# Patient Record
Sex: Male | Born: 1965 | Hispanic: Yes | Marital: Married | Smoking: Never smoker
Health system: Southern US, Community
[De-identification: ages and names within clinical notes are randomized; demographics above are authoritative.]

## PROBLEM LIST (undated history)

## (undated) DIAGNOSIS — R739 Hyperglycemia, unspecified: Secondary | ICD-10-CM

## (undated) DIAGNOSIS — J45909 Unspecified asthma, uncomplicated: Secondary | ICD-10-CM

## (undated) DIAGNOSIS — E119 Type 2 diabetes mellitus without complications: Secondary | ICD-10-CM

## (undated) DIAGNOSIS — M170 Bilateral primary osteoarthritis of knee: Secondary | ICD-10-CM

## (undated) DIAGNOSIS — E669 Obesity, unspecified: Secondary | ICD-10-CM

## (undated) DIAGNOSIS — I10 Essential (primary) hypertension: Secondary | ICD-10-CM

## (undated) DIAGNOSIS — M109 Gout, unspecified: Secondary | ICD-10-CM

## (undated) DIAGNOSIS — G4730 Sleep apnea, unspecified: Secondary | ICD-10-CM

## (undated) DIAGNOSIS — E7800 Pure hypercholesterolemia, unspecified: Secondary | ICD-10-CM

## (undated) HISTORY — DX: Pure hypercholesterolemia, unspecified: E78.00

## (undated) HISTORY — DX: Hyperglycemia, unspecified: R73.9

## (undated) HISTORY — DX: Sleep apnea, unspecified: G47.30

## (undated) HISTORY — DX: Type 2 diabetes mellitus without complications: E11.9

## (undated) HISTORY — DX: Gout, unspecified: M10.9

## (undated) HISTORY — DX: Bilateral primary osteoarthritis of knee: M17.0

## (undated) HISTORY — DX: Essential (primary) hypertension: I10

## (undated) HISTORY — DX: Obesity, unspecified: E66.9

---

## 2008-11-09 ENCOUNTER — Ambulatory Visit: Payer: Self-pay | Admitting: Gastroenterology

## 2009-03-05 ENCOUNTER — Ambulatory Visit: Payer: Self-pay | Admitting: Otolaryngology

## 2009-07-29 IMAGING — RF DG UGI W/ KUB
1 series · 15 of 19 positions shown · non-contrast
Comparison: none

REASON FOR EXAM: atypical chest pain
COMMENTS:

[Series 1: run · 15 of 19 slices shown]
[im 1/19]
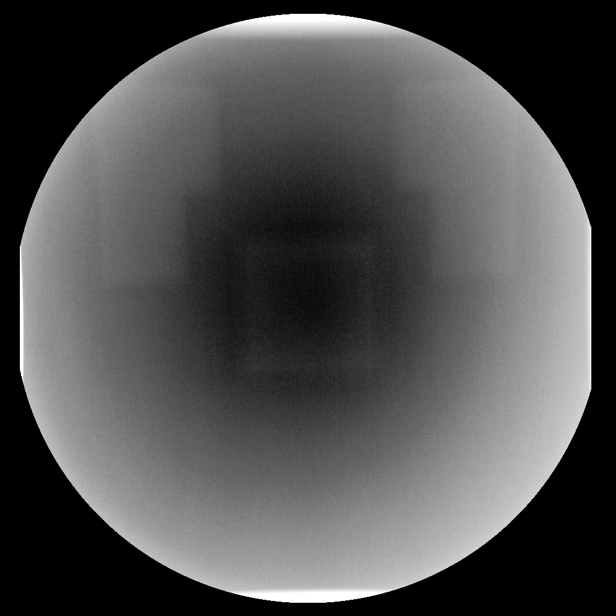
[im 2/19]
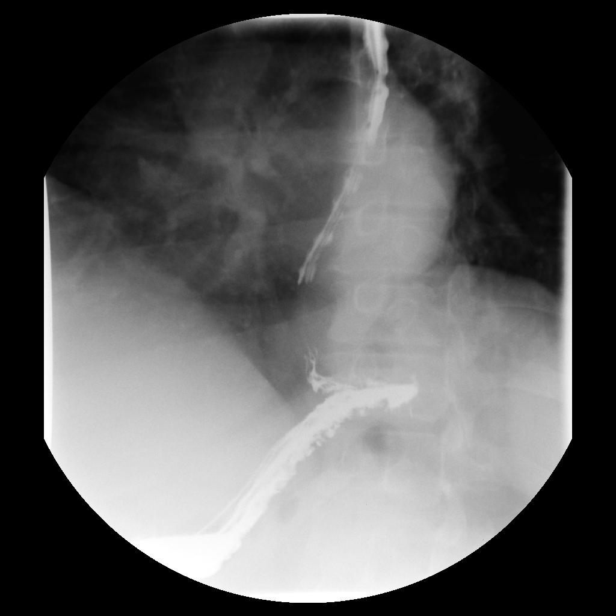
[im 4/19]
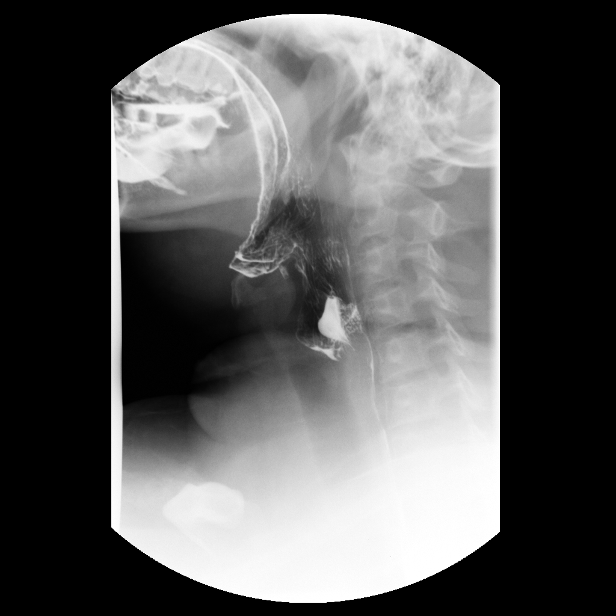
[im 5/19]
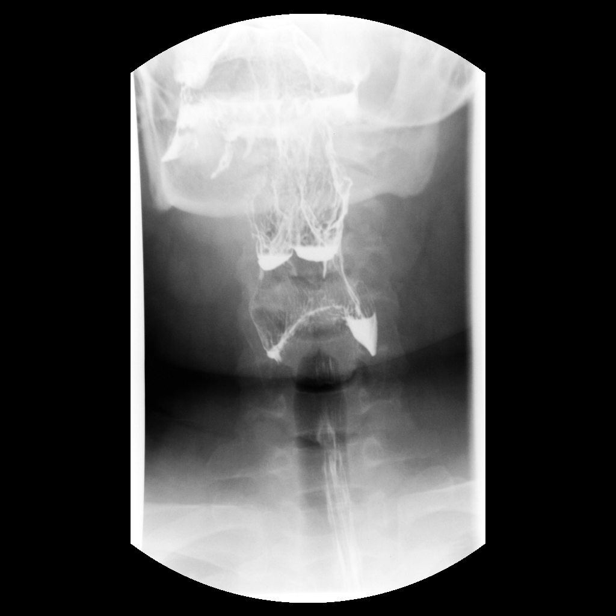
[im 6/19]
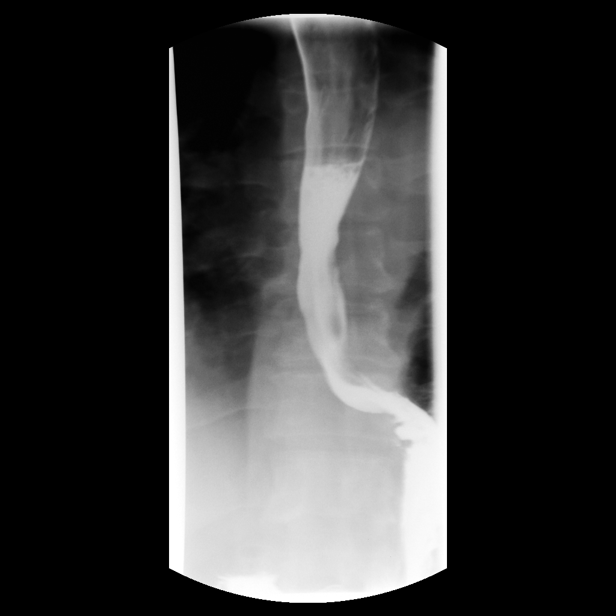
[im 7/19]
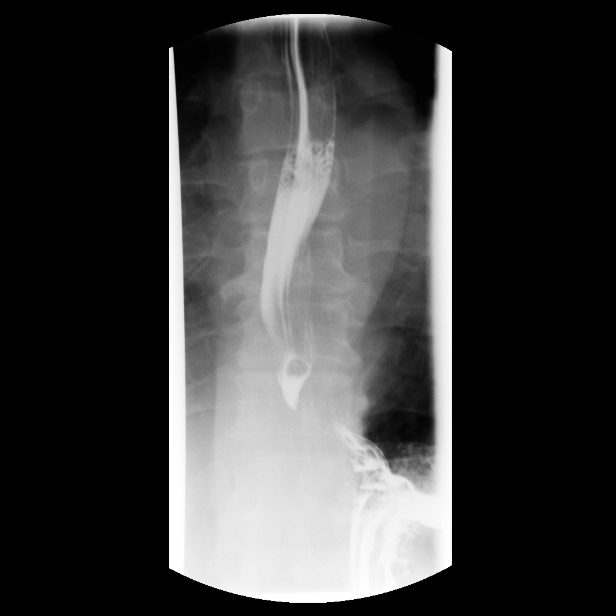
[im 9/19]
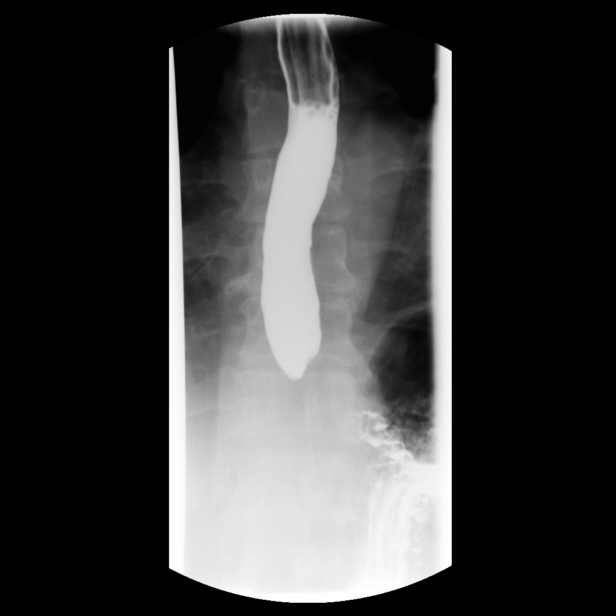
[im 10/19]
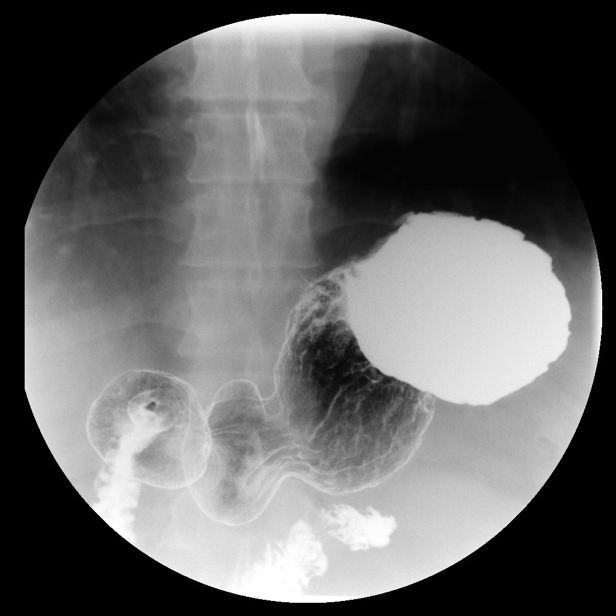
[im 11/19]
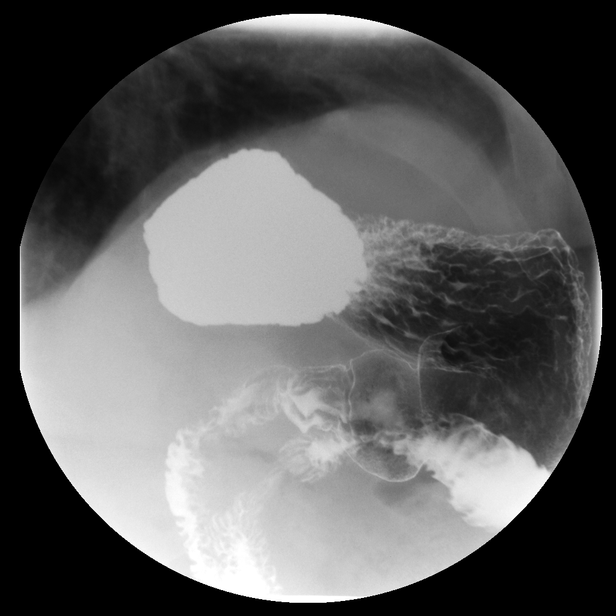
[im 13/19]
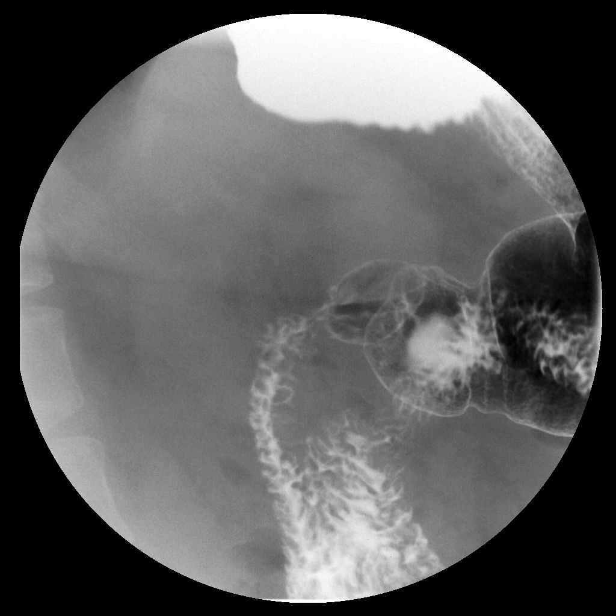
[im 14/19]
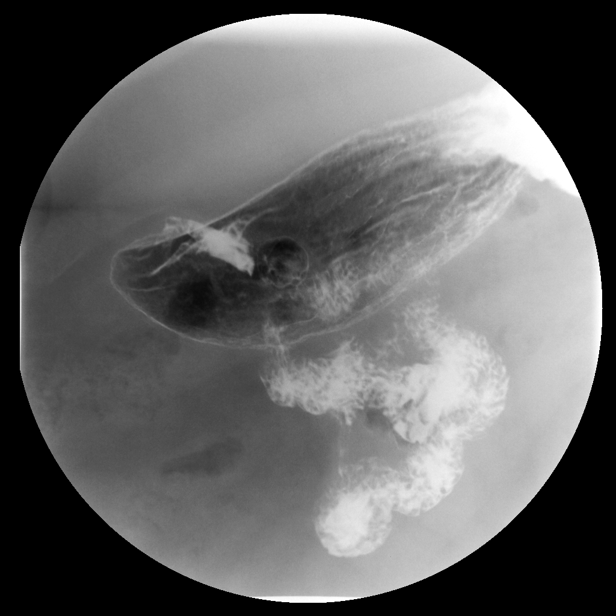
[im 15/19]
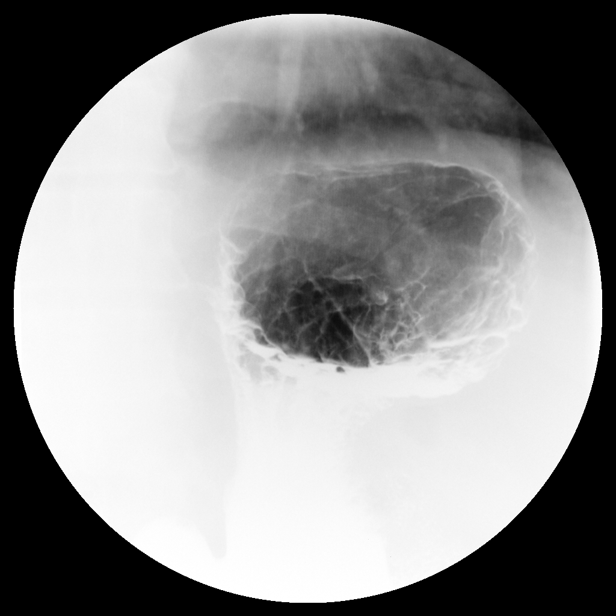
[im 16/19]
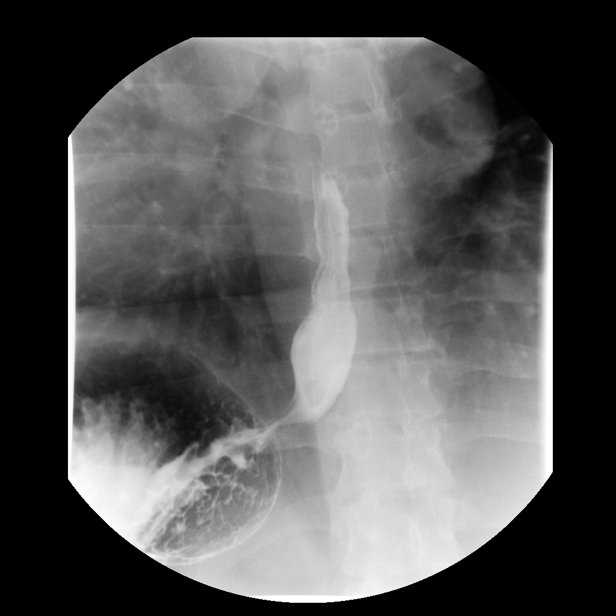
[im 18/19]
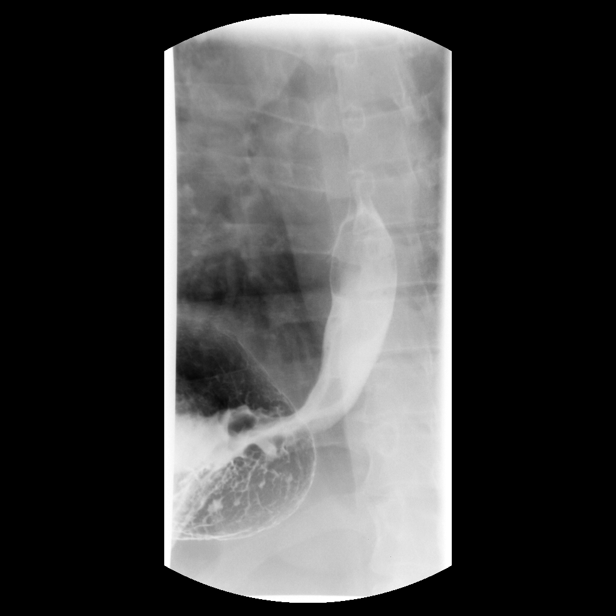
[im 19/19]
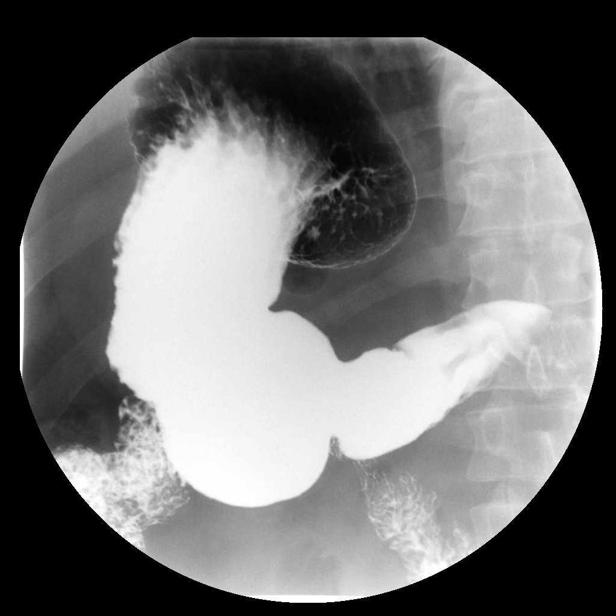

[15 of 19 positions shown; findings below may reference images not displayed]

PROCEDURE:     FL  - FL UPPER GI W/ BARIUM SWALLOW  - November 09, 2008  [DATE]

RESULT:     The anticipated procedure was discussed with Mr. El Flaco and he
voiced his willingness to proceed. The patient ingested barium without
difficulty. There was no laryngeal penetration of the barium. Esophageal
motility was normal. There was no evidence of stricture or esophagitis.
There was a small reducible hiatal hernia. No significant gastroesophageal
reflux was demonstrated. A 12 mm barium pill passed without difficulty.

The stomach distended well. There was no evidence of an ulceration or mass.
The gastric mucosal folds were not abnormally thickened. The duodenal bulb
and C-sweep are normal in appearance.
IMPRESSION: Normal barium swallow with upper GI series.

## 2011-12-14 ENCOUNTER — Ambulatory Visit: Payer: Self-pay | Admitting: Family Medicine

## 2011-12-19 ENCOUNTER — Ambulatory Visit: Payer: Self-pay | Admitting: Family Medicine

## 2012-01-31 ENCOUNTER — Ambulatory Visit: Payer: Self-pay | Admitting: Gastroenterology

## 2016-12-22 DIAGNOSIS — G4733 Obstructive sleep apnea (adult) (pediatric): Secondary | ICD-10-CM | POA: Diagnosis not present

## 2017-03-23 DIAGNOSIS — G4733 Obstructive sleep apnea (adult) (pediatric): Secondary | ICD-10-CM | POA: Diagnosis not present

## 2017-05-16 DIAGNOSIS — I10 Essential (primary) hypertension: Secondary | ICD-10-CM | POA: Diagnosis not present

## 2017-05-16 DIAGNOSIS — J4521 Mild intermittent asthma with (acute) exacerbation: Secondary | ICD-10-CM | POA: Diagnosis not present

## 2017-05-16 DIAGNOSIS — G4733 Obstructive sleep apnea (adult) (pediatric): Secondary | ICD-10-CM | POA: Diagnosis not present

## 2017-06-29 DIAGNOSIS — G4733 Obstructive sleep apnea (adult) (pediatric): Secondary | ICD-10-CM | POA: Diagnosis not present

## 2017-08-03 DIAGNOSIS — M25561 Pain in right knee: Secondary | ICD-10-CM | POA: Diagnosis not present

## 2017-08-03 DIAGNOSIS — Z0000 Encounter for general adult medical examination without abnormal findings: Secondary | ICD-10-CM | POA: Diagnosis not present

## 2017-10-02 DIAGNOSIS — G4733 Obstructive sleep apnea (adult) (pediatric): Secondary | ICD-10-CM | POA: Diagnosis not present

## 2017-10-05 DIAGNOSIS — Z23 Encounter for immunization: Secondary | ICD-10-CM | POA: Diagnosis not present

## 2017-10-22 DIAGNOSIS — B9689 Other specified bacterial agents as the cause of diseases classified elsewhere: Secondary | ICD-10-CM | POA: Diagnosis not present

## 2017-10-22 DIAGNOSIS — J019 Acute sinusitis, unspecified: Secondary | ICD-10-CM | POA: Diagnosis not present

## 2017-10-22 DIAGNOSIS — J209 Acute bronchitis, unspecified: Secondary | ICD-10-CM | POA: Diagnosis not present

## 2018-01-07 DIAGNOSIS — J22 Unspecified acute lower respiratory infection: Secondary | ICD-10-CM | POA: Diagnosis not present

## 2018-01-07 DIAGNOSIS — R6889 Other general symptoms and signs: Secondary | ICD-10-CM | POA: Diagnosis not present

## 2018-01-08 DIAGNOSIS — G4733 Obstructive sleep apnea (adult) (pediatric): Secondary | ICD-10-CM | POA: Diagnosis not present

## 2018-01-28 DIAGNOSIS — M25561 Pain in right knee: Secondary | ICD-10-CM | POA: Diagnosis not present

## 2018-01-28 DIAGNOSIS — Z6841 Body Mass Index (BMI) 40.0 and over, adult: Secondary | ICD-10-CM | POA: Diagnosis not present

## 2018-01-28 DIAGNOSIS — I10 Essential (primary) hypertension: Secondary | ICD-10-CM | POA: Diagnosis not present

## 2018-02-04 DIAGNOSIS — R5383 Other fatigue: Secondary | ICD-10-CM | POA: Diagnosis not present

## 2018-02-04 DIAGNOSIS — I10 Essential (primary) hypertension: Secondary | ICD-10-CM | POA: Diagnosis not present

## 2018-02-04 DIAGNOSIS — J45909 Unspecified asthma, uncomplicated: Secondary | ICD-10-CM | POA: Diagnosis not present

## 2018-02-15 ENCOUNTER — Ambulatory Visit: Payer: 59 | Attending: Internal Medicine

## 2018-02-15 DIAGNOSIS — G4761 Periodic limb movement disorder: Secondary | ICD-10-CM | POA: Insufficient documentation

## 2018-02-15 DIAGNOSIS — G4733 Obstructive sleep apnea (adult) (pediatric): Secondary | ICD-10-CM | POA: Diagnosis not present

## 2018-03-21 DIAGNOSIS — G4733 Obstructive sleep apnea (adult) (pediatric): Secondary | ICD-10-CM | POA: Diagnosis not present

## 2018-03-30 DIAGNOSIS — J0100 Acute maxillary sinusitis, unspecified: Secondary | ICD-10-CM | POA: Diagnosis not present

## 2018-04-20 DIAGNOSIS — G4733 Obstructive sleep apnea (adult) (pediatric): Secondary | ICD-10-CM | POA: Diagnosis not present

## 2018-05-21 DIAGNOSIS — G4733 Obstructive sleep apnea (adult) (pediatric): Secondary | ICD-10-CM | POA: Diagnosis not present

## 2018-06-20 DIAGNOSIS — G4733 Obstructive sleep apnea (adult) (pediatric): Secondary | ICD-10-CM | POA: Diagnosis not present

## 2018-06-21 DIAGNOSIS — G4733 Obstructive sleep apnea (adult) (pediatric): Secondary | ICD-10-CM | POA: Diagnosis not present

## 2018-07-29 DIAGNOSIS — J45909 Unspecified asthma, uncomplicated: Secondary | ICD-10-CM | POA: Diagnosis not present

## 2018-07-29 DIAGNOSIS — G4733 Obstructive sleep apnea (adult) (pediatric): Secondary | ICD-10-CM | POA: Diagnosis not present

## 2018-07-29 DIAGNOSIS — I10 Essential (primary) hypertension: Secondary | ICD-10-CM | POA: Diagnosis not present

## 2018-07-31 DIAGNOSIS — G4733 Obstructive sleep apnea (adult) (pediatric): Secondary | ICD-10-CM | POA: Diagnosis not present

## 2018-08-05 DIAGNOSIS — G4733 Obstructive sleep apnea (adult) (pediatric): Secondary | ICD-10-CM | POA: Diagnosis not present

## 2018-08-05 DIAGNOSIS — M1711 Unilateral primary osteoarthritis, right knee: Secondary | ICD-10-CM | POA: Diagnosis not present

## 2018-08-31 DIAGNOSIS — G4733 Obstructive sleep apnea (adult) (pediatric): Secondary | ICD-10-CM | POA: Diagnosis not present

## 2018-09-30 DIAGNOSIS — G4733 Obstructive sleep apnea (adult) (pediatric): Secondary | ICD-10-CM | POA: Diagnosis not present

## 2018-10-06 DIAGNOSIS — Z23 Encounter for immunization: Secondary | ICD-10-CM | POA: Diagnosis not present

## 2018-10-11 DIAGNOSIS — G4733 Obstructive sleep apnea (adult) (pediatric): Secondary | ICD-10-CM | POA: Diagnosis not present

## 2018-10-31 DIAGNOSIS — G4733 Obstructive sleep apnea (adult) (pediatric): Secondary | ICD-10-CM | POA: Diagnosis not present

## 2018-11-30 DIAGNOSIS — G4733 Obstructive sleep apnea (adult) (pediatric): Secondary | ICD-10-CM | POA: Diagnosis not present

## 2018-12-31 DIAGNOSIS — G4733 Obstructive sleep apnea (adult) (pediatric): Secondary | ICD-10-CM | POA: Diagnosis not present

## 2019-01-10 DIAGNOSIS — G4733 Obstructive sleep apnea (adult) (pediatric): Secondary | ICD-10-CM | POA: Diagnosis not present

## 2019-01-29 DIAGNOSIS — G4733 Obstructive sleep apnea (adult) (pediatric): Secondary | ICD-10-CM | POA: Diagnosis not present

## 2019-01-29 DIAGNOSIS — M1711 Unilateral primary osteoarthritis, right knee: Secondary | ICD-10-CM | POA: Diagnosis not present

## 2019-01-29 DIAGNOSIS — Z6841 Body Mass Index (BMI) 40.0 and over, adult: Secondary | ICD-10-CM | POA: Diagnosis not present

## 2019-01-29 DIAGNOSIS — E782 Mixed hyperlipidemia: Secondary | ICD-10-CM | POA: Diagnosis not present

## 2019-01-29 DIAGNOSIS — Z125 Encounter for screening for malignant neoplasm of prostate: Secondary | ICD-10-CM | POA: Diagnosis not present

## 2019-01-31 DIAGNOSIS — G4733 Obstructive sleep apnea (adult) (pediatric): Secondary | ICD-10-CM | POA: Diagnosis not present

## 2019-02-05 DIAGNOSIS — M1711 Unilateral primary osteoarthritis, right knee: Secondary | ICD-10-CM | POA: Diagnosis not present

## 2019-02-05 DIAGNOSIS — Z6841 Body Mass Index (BMI) 40.0 and over, adult: Secondary | ICD-10-CM | POA: Diagnosis not present

## 2019-02-05 DIAGNOSIS — Z0000 Encounter for general adult medical examination without abnormal findings: Secondary | ICD-10-CM | POA: Diagnosis not present

## 2019-03-01 DIAGNOSIS — G4733 Obstructive sleep apnea (adult) (pediatric): Secondary | ICD-10-CM | POA: Diagnosis not present

## 2019-04-01 DIAGNOSIS — G4733 Obstructive sleep apnea (adult) (pediatric): Secondary | ICD-10-CM | POA: Diagnosis not present

## 2023-02-22 ENCOUNTER — Ambulatory Visit: Payer: BC Managed Care – PPO | Attending: Otolaryngology

## 2023-02-22 DIAGNOSIS — M170 Bilateral primary osteoarthritis of knee: Secondary | ICD-10-CM | POA: Insufficient documentation

## 2023-02-22 DIAGNOSIS — G4733 Obstructive sleep apnea (adult) (pediatric): Secondary | ICD-10-CM | POA: Insufficient documentation

## 2023-02-22 DIAGNOSIS — E1165 Type 2 diabetes mellitus with hyperglycemia: Secondary | ICD-10-CM | POA: Insufficient documentation

## 2023-02-22 DIAGNOSIS — Z6841 Body Mass Index (BMI) 40.0 and over, adult: Secondary | ICD-10-CM | POA: Insufficient documentation

## 2023-02-22 DIAGNOSIS — M545 Low back pain, unspecified: Secondary | ICD-10-CM | POA: Diagnosis not present

## 2023-02-22 DIAGNOSIS — J4520 Mild intermittent asthma, uncomplicated: Secondary | ICD-10-CM | POA: Diagnosis not present

## 2023-02-22 DIAGNOSIS — E783 Hyperchylomicronemia: Secondary | ICD-10-CM | POA: Diagnosis not present

## 2023-02-22 DIAGNOSIS — I10 Essential (primary) hypertension: Secondary | ICD-10-CM | POA: Insufficient documentation

## 2023-05-01 ENCOUNTER — Emergency Department
Admission: EM | Admit: 2023-05-01 | Discharge: 2023-05-01 | Disposition: A | Discharge status: Home / Self Care | Payer: BC Managed Care – PPO | Attending: Student in an Organized Health Care Education/Training Program | Admitting: Student in an Organized Health Care Education/Training Program

## 2023-05-01 ENCOUNTER — Other Ambulatory Visit: Payer: Self-pay

## 2023-05-01 DIAGNOSIS — M5432 Sciatica, left side: Secondary | ICD-10-CM

## 2023-05-01 DIAGNOSIS — M79605 Pain in left leg: Secondary | ICD-10-CM | POA: Diagnosis present

## 2023-05-01 HISTORY — DX: Unspecified asthma, uncomplicated: J45.909

## 2023-05-01 MED ORDER — OXYCODONE-ACETAMINOPHEN 5-325 MG PO TABS: 1.0000 | ORAL_TABLET | ORAL | 0 refills | Status: AC | PRN

## 2023-05-01 MED ORDER — OXYCODONE-ACETAMINOPHEN 5-325 MG PO TABS
1.0000 | ORAL_TABLET | Freq: Once | ORAL | Status: AC
  Administered 2023-05-01: 1 via ORAL

## 2023-05-01 NOTE — ED Triage Notes (Signed)
Pt to ED POV for L leg pain that starts in L buttock and radiates down L leg since Saturday (3 days) and numbness to same leg since 2 days. No hx of same.  Pt walks with cane. Pt unable to sit for triage.

## 2023-05-01 NOTE — ED Provider Notes (Signed)
[facility] Provider Note    Event Date/Time   First MD Initiated Contact with Patient 05/01/23 1753     (approximate)   History   Leg Pain   HPI  Jalan Kina is a 57 y.o. male with a history of right-sided sciatica presents to the ER for evaluation of the left sciatica symptoms.  Started yesterday.  Over the weekend he was helping friends lift heavy equipment.  Denies any loss of bowel or bladder control no numbness or tingling.  Has pain shooting down the back of his leg.  Denies any fevers or chills.  Saw his primary doctor yesterday was put on prednisone as well as tramadol but not getting relief from tramadol.     Physical Exam   Triage Vital Signs: ED Triage Vitals  Enc Vitals Group     BP 05/01/23 1613 (!) 141/81     Pulse Rate 05/01/23 1613 98     Resp 05/01/23 1613 20     Temp 05/01/23 1613 98.9 F (37.2 C)     Temp Source 05/01/23 1613 Oral     SpO2 05/01/23 1613 96 %     Weight 05/01/23 1609 (!) 328 lb (148.8 kg)     Height 05/01/23 1609 5\' 5"  (1.651 m)     Head Circumference --      Peak Flow --      Pain Score 05/01/23 1609 10     Pain Loc --      Pain Edu? --      Excl. in GC? --     Most recent vital signs: Vitals:   05/01/23 1613  BP: (!) 141/81  Pulse: 98  Resp: 20  Temp: 98.9 F (37.2 C)  SpO2: 96%     Constitutional: Alert  Eyes: Conjunctivae are normal.  Head: Atraumatic. Nose: No congestion/rhinnorhea. Mouth/Throat: Mucous membranes are moist.   Neck: Painless ROM.  Cardiovascular:   Good peripheral circulation. Respiratory: Normal respiratory effort.  No retractions.  Gastrointestinal: Soft and nontender.  Musculoskeletal:  no deformity Neurologic:  MAE spontaneously. No gross focal neurologic deficits are appreciated. +SLR left, DTRs present and equal.  Sensation intact to light touch. Skin:  Skin is warm, dry and intact. No rash noted. Psychiatric: Mood and affect are normal. Speech and behavior are  normal.    ED Results / Procedures / Treatments   Labs (all labs ordered are listed, but only abnormal results are displayed) Labs Reviewed - No data to display   EKG     RADIOLOGY    PROCEDURES:  Critical Care performed:   Procedures   MEDICATIONS ORDERED IN ED: Medications  oxyCODONE-acetaminophen (PERCOCET/ROXICET) 5-325 MG per tablet 1 tablet (has no administration in time range)     IMPRESSION / MDM / ASSESSMENT AND PLAN / ED COURSE  I reviewed the triage vital signs and the nursing notes.                              Differential diagnosis includes, but is not limited to, sciatica, lumbago, musculoskeletal strain, fracture, arthritis  Patient here with left low back pain pain and sx c/w sciatica.  No history of trauma. No recent back instrumentation/procedures. No fevers. Denies cord compression symptoms. No bowel/bladder incontinence or retention, no LE weakness. VSS in ED. Exam with no LE weakness bilat., no sensory deficits, normal DTRs, no clonus, no saddle anesthesia.  History and physical exam less consistent  with kidney stone or pyelonephritis. Treatments will include observation, analgesia, and arrange appropriate follow up for recheck.  Clinical picture is not consistent with epidural abscess , fracture, or cauda equina syndrome. Plan supportive care, follow up for recheck        FINAL CLINICAL IMPRESSION(S) / ED DIAGNOSES   Final diagnoses:  Sciatica of left side     Rx / DC Orders   ED Discharge Orders     None        Note:  This document was prepared using Dragon voice recognition software and may include unintentional dictation errors.    Willy Eddy, MD 05/01/23 [phone]

## 2023-05-03 ENCOUNTER — Other Ambulatory Visit (HOSPITAL_COMMUNITY): Payer: Self-pay | Admitting: Internal Medicine

## 2023-05-03 DIAGNOSIS — R202 Paresthesia of skin: Secondary | ICD-10-CM

## 2023-05-03 DIAGNOSIS — M79661 Pain in right lower leg: Secondary | ICD-10-CM

## 2023-05-03 DIAGNOSIS — M79662 Pain in left lower leg: Secondary | ICD-10-CM

## 2023-05-03 DIAGNOSIS — R200 Anesthesia of skin: Secondary | ICD-10-CM

## 2023-05-03 DIAGNOSIS — M5416 Radiculopathy, lumbar region: Secondary | ICD-10-CM

## 2023-05-04 ENCOUNTER — Ambulatory Visit
Admission: RE | Admit: 2023-05-04 | Discharge: 2023-05-04 | Disposition: A | Discharge status: Home / Self Care | Payer: BC Managed Care – PPO | Source: Ambulatory Visit | Attending: Internal Medicine | Admitting: Internal Medicine

## 2023-05-04 DIAGNOSIS — M5416 Radiculopathy, lumbar region: Secondary | ICD-10-CM | POA: Insufficient documentation

## 2023-05-04 DIAGNOSIS — R200 Anesthesia of skin: Secondary | ICD-10-CM

## 2023-05-04 DIAGNOSIS — M79662 Pain in left lower leg: Secondary | ICD-10-CM | POA: Diagnosis present

## 2023-05-04 DIAGNOSIS — R202 Paresthesia of skin: Secondary | ICD-10-CM | POA: Insufficient documentation

## 2023-05-04 DIAGNOSIS — M79661 Pain in right lower leg: Secondary | ICD-10-CM | POA: Diagnosis present

## 2023-05-04 MED ORDER — GADOBUTROL 1 MMOL/ML IV SOLN
10.0000 mL | Freq: Once | INTRAVENOUS | Status: AC | PRN
  Administered 2023-05-04: 10 mL via INTRAVENOUS

## 2023-05-21 ENCOUNTER — Ambulatory Visit: Payer: BC Managed Care – PPO | Admitting: Orthopedic Surgery

## 2023-05-29 ENCOUNTER — Ambulatory Visit: Payer: BC Managed Care – PPO | Admitting: Orthopedic Surgery

## 2023-05-31 NOTE — Progress Notes (Signed)
Referring Physician:  Barbette Reichmann, MD [address] [facility] [place],  [place] [address]  Primary Physician:  Barbette Reichmann, MD  History of Present Illness: 06/05/2023 [name] has a history of DM, HTN, obesity, OSA with CPAP, gout, asthma,   Seen by PCP on 05/03/23 with 1 week of LBP and left buttock pain into his leg.   Seen in ED on 05/01/23- given percocet. Has been on prednisone taper and PCP gave him neurontin and zanaflex. He notes some relief with neurontin.   Had US done on both legs on 05/04/23- negative for DVT.   He has 4 week history of more constant LBP/left buttock pain that radiates into left leg to his ankle. Pain started after doing heavy lifting at work on 04/27/23- he had some discomfort, but this became severe the next day. Numbness in left ankle and calf. Pain is worse at night and with prolonged sitting. No right leg pain. Some improvement with neurontin as above. He feels some weakness in left leg- he is having to use a cane.   This is under WC.   Bowel/Bladder Dysfunction: none  Last HgbA1c was 6.2 on 04/23/23.   Conservative measures:  Physical therapy: has not participated  Multimodal medical therapy including regular antiinflammatories: percocet, motrin, ultram, prednisone, neurontin, zanaflex   Injections: has not received epidural steroid injections  Past Surgery: no previous spine surgery  Wessley Gortney has no symptoms of cervical myelopathy.  The symptoms are causing a significant impact on the patient's life.   Review of Systems:  A 10 point review of systems is negative, except for the pertinent positives and negatives detailed in the HPI.  Past Medical History: Past Medical History:  Diagnosis Date   Asthma     Past Surgical History: No past surgical history on file.  Allergies: Allergies as of 06/05/2023   (No Known Allergies)    Medications: No outpatient encounter medications on file as of  06/05/2023.   No facility-administered encounter medications on file as of 06/05/2023.    Social History: Social History   Tobacco Use   Smoking status: Never   Smokeless tobacco: Never  Substance Use Topics   Alcohol use: Yes    Comment: occ   Drug use: Never    Family Medical History: No family history on file.  Physical Examination: Vitals:   06/05/23 1411  BP: 138/84    General: Patient is well developed, well nourished, calm, collected, and in no apparent distress. Attention to examination is appropriate.  Respiratory: Patient is breathing without any difficulty.   NEUROLOGICAL:     Awake, alert, oriented to person, place, and time.  Speech is clear and fluent. Fund of knowledge is appropriate.   Cranial Nerves: Pupils equal round and reactive to light.  Facial tone is symmetric.    Mild left sided lower posterior lumbar tenderness.   No abnormal lesions on exposed skin.   Strength: Side Biceps Triceps Deltoid Interossei Grip Wrist Ext. Wrist Flex.  R 5 5 5 5 5 5 5   L 5 5 5 5 5 5 5    Side Iliopsoas Quads Hamstring PF DF EHL  R 5 5 5 5 5 5   L 5 5 5 5 5 5    Reflexes are 2+ and symmetric at the biceps, triceps, brachioradialis, patella and achilles.   Hoffman's is absent.  Clonus is not present.   Bilateral upper and lower extremity sensation is intact to light touchm, but diminished in  left lateral calf and medial ankle.   He ambulates slowly with a cane.    Medical Decision Making  Imaging: MRI of lumbar spine dated 05/04/23:   FINDINGS: Segmentation: Lumbar segmentation appears to be normal and will be designated as such for this report.   Alignment: Mild grade 1 anterolisthesis of L4 on L5. Elsewhere preserved lumbar lordosis. Superimposed mild levoconvex lower lumbar scoliosis.   Vertebrae: No marrow edema or evidence of acute osseous abnormality. Visualized bone marrow signal is within normal limits. Intact visible sacrum and SI joints.    Conus medullaris and cauda equina: Conus extends to the T12-L1 level. No lower spinal cord or conus signal abnormality.   Paraspinal and other soft tissues: Visualized abdominal viscera and paraspinal soft tissues are within normal limits. Diverticulosis of the large bowel is visible at the pelvic inlet.   Disc levels:   T12-L1:  Negative.   L1-L2: Subtle disc space loss and circumferential disc bulging. No stenosis.   L2-L3: Disc desiccation. Circumferential disc bulge. Mild facet hypertrophy. No significant spinal stenosis. Mild left L2 neural foraminal stenosis related to broad-based left foraminal disc (series 8 image 17).   L3-L4: Less pronounced circumferential disc bulging. Mild facet hypertrophy. Borderline to mild bilateral L3 neural foraminal stenosis.   L4-L5: Mild grade 1 anterolisthesis. Disc desiccation with mild circumferential disc/pseudo disc. Superimposed moderate to severe bilateral facet hypertrophy.   Furthermore, small caudal disc extrusion into the left lateral recess best demonstrated on series 5, image 10, series 8, image 29. Small annular fissure of the disc there. Mild overall spinal stenosis but moderate to severe left lateral recess stenosis (left L5 nerve level). Mild to moderate left but moderate to severe right L4 neural foraminal stenosis, in part due to facet hypertrophy but on the right also related to the focal foraminal disc on series 5, image 5.   L5-S1: Mild mostly posterior disc bulging. Mild to moderate facet hypertrophy. No spinal or lateral recess stenosis. Borderline to mild L5 foraminal stenosis.   IMPRESSION: 1. Symptomatic level favored to be L4-L5, where a small caudal disc herniation into the left lateral recess is superimposed on mild grade 1 anterolisthesis with disc bulging and facet arthropathy. Query Left L5 radiculitis. Mild overall spinal stenosis. And note moderate to severe right L4 neural foraminal stenosis as  well. 2. Lesser lumbar disc degeneration elsewhere, with up to mild foraminal stenosis. The L1-L2 level is normal for age. 3. Large bowel diverticulosis.     Electronically Signed   By: Odessa Fleming M.D.   On: 05/04/2023 11:28  I have personally reviewed the images and agree with the above interpretation.  Assessment and Plan: Mr. Elza is a pleasant 57 y.o. male has  4 week history of more constant LBP/left buttock pain that radiates into left leg to his ankle. Pain started after doing heavy lifting at work on 04/27/23. No right leg pain. He feels some weakness in left leg- he is having to use a cane.   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 Also with mild left foraminal stenosis L5-S1. Primary pain generator is likely L4-L5.   Treatment options discussed with patient and following plan made:   - Order for physical therapy for lumbar spine.  - Continue current medications including neurontin. Reviewed dosing and side effects.  - Referral to PMR/pain management to discuss possible lumbar injections.  - He is under WC. Will  let them know about PT and injections orders and they will let him know where he will be scheduled.  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  - Weight loss discussed and encouraged.  - Follow up with me in 6-8 weeks and prn.   I spent a total of 30 minutes in face-to-face and non-face-to-face activities related to this patient's care today including review of outside records, review of imaging, review of symptoms, physical exam, discussion of differential diagnosis, discussion of treatment options, and documentation.   Thank you for involving me in the care of this patient.   Drake Leach PA-C Dept. of Neurosurgery

## 2023-06-05 ENCOUNTER — Telehealth: Payer: Self-pay | Admitting: Orthopedic Surgery

## 2023-06-05 ENCOUNTER — Ambulatory Visit (INDEPENDENT_AMBULATORY_CARE_PROVIDER_SITE_OTHER): Payer: BC Managed Care – PPO | Admitting: Orthopedic Surgery

## 2023-06-05 ENCOUNTER — Encounter: Payer: Self-pay | Admitting: Orthopedic Surgery

## 2023-06-05 VITALS — BP 138/84 | Ht 65.0 in | Wt 322.8 lb

## 2023-06-05 DIAGNOSIS — M5126 Other intervertebral disc displacement, lumbar region: Secondary | ICD-10-CM

## 2023-06-05 DIAGNOSIS — M48061 Spinal stenosis, lumbar region without neurogenic claudication: Secondary | ICD-10-CM | POA: Diagnosis not present

## 2023-06-05 DIAGNOSIS — M5116 Intervertebral disc disorders with radiculopathy, lumbar region: Secondary | ICD-10-CM

## 2023-06-05 DIAGNOSIS — M5416 Radiculopathy, lumbar region: Secondary | ICD-10-CM

## 2023-06-05 NOTE — Patient Instructions (Signed)
It was so nice to see you today. Thank you so much for coming in.    You have some wear and tear (arthritis) in your back. I think most of your pain is from L4-L5 where you have a small disc herniation.   I put in physical therapy orders for your back. Workers Comp will call and let you know where they want you to go.  I put in orders for you to see either pain management or physical medicine and rehab to discuss possible injections in your lower back. Workers comp will call and let you know where they want you to go.   I gave you a work note keeping you out until you see me back on 07/30/23.  Please do not hesitate to call if you have any questions or concerns. You can also message me in MyChart.   Drake Leach PA-C [phone]

## 2023-06-05 NOTE — Telephone Encounter (Signed)
He is WC.   I ordered lumbar xrays with PMR and lumbar PT.  If WC wants him to go to pain management, I can put in new order.

## 2023-06-06 NOTE — Telephone Encounter (Signed)
Okay. Thanks for letting me know!

## 2023-06-06 NOTE — Telephone Encounter (Signed)
Left message for Chad Patel at [phone] claim# [id number]

## 2023-06-06 NOTE — Telephone Encounter (Signed)
Megan Salon called back. They are still investigating the case and it has not been approved by workman's comp.  I called the patient and made him aware that his appt from yesterday will be filed to his personal insurance he was fine with that. Regarding the referrals he wants to wait and see if his case gets approved before we send the referrals out. He will notify the office in about 2 weeks.

## 2023-07-02 NOTE — Telephone Encounter (Signed)
Patient calling back. Workmans comp declined his case. He needs the referrals to be made under his personal insurance.

## 2023-07-02 NOTE — Addendum Note (Signed)
Addended byDrake Leach on: 07/02/2023 12:45 PM   Modules accepted: Orders

## 2023-07-02 NOTE — Telephone Encounter (Signed)
I put in orders for PMR for injections at [facility].   I did external lumbar PT orders, can send him wherever he likes to go. I recommend [facility], or [facility].   Thanks.

## 2023-07-02 NOTE — Telephone Encounter (Signed)
PT referral sent to [facility] on [address]

## 2023-07-02 NOTE — Telephone Encounter (Signed)
Referral sent to Dr.Chasnis

## 2023-07-04 NOTE — Telephone Encounter (Signed)
Chad Patel 07/18/2023

## 2023-07-17 ENCOUNTER — Telehealth: Payer: Self-pay | Admitting: Orthopedic Surgery

## 2023-07-17 NOTE — Telephone Encounter (Signed)
Patient walked into the office yesterday afternoon and dropped of disability forms. He has been out of work since 04/27/2023. That's when he had his work injury and Dr.Hande was treating him until he referred the patient to neurosurgery. Can we fill out the form?

## 2023-07-17 NOTE — Telephone Encounter (Signed)
Okay to keep him out from injury on 04/27/23 until f/u with me on 07/30/23.

## 2023-07-18 NOTE — Progress Notes (Deleted)
Referring Physician:  Barbette Reichmann, MD [address] [facility] [place],  [place] [address]  Primary Physician:  Barbette Reichmann, MD  History of Present Illness: 07/18/2023 [name] has a history of DM, HTN, obesity, OSA with CPAP, gout, asthma.   Last seen by me on 06/05/23 for LBP and left leg pain that started after doing heavy lifting at work on 04/27/23. 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 Also with mild left foraminal stenosis L5-S1. Primary pain generator is likely L4-L5.   He was sent to PT (did not go?***) and referred to PMR to discuss lumbar injections. He is out of work. Case was denied by WC.   He has not seen PMR yet?***     Seen by PCP on 05/03/23 with 1 week of LBP and left buttock pain into his leg.   Seen in ED on 05/01/23- given percocet. Has been on prednisone taper and PCP gave him neurontin and zanaflex. He notes some relief with neurontin.   Had US done on both legs on 05/04/23- negative for DVT.   He has 4 week history of more constant LBP/left buttock pain that radiates into left leg to his ankle. Pain started after doing heavy lifting at work on 04/27/23- he had some discomfort, but this became severe the next day. Numbness in left ankle and calf. Pain is worse at night and with prolonged sitting. No right leg pain. Some improvement with neurontin as above. He feels some weakness in left leg- he is having to use a cane.         Bowel/Bladder Dysfunction: none  Last HgbA1c was 6.2 on 04/23/23.   Conservative measures:  Physical therapy: has not participated  Multimodal medical therapy including regular antiinflammatories: percocet, motrin, ultram, prednisone, neurontin, zanaflex   Injections: has not received epidural steroid injections  Past Surgery: no previous spine surgery  Chad Patel has no  symptoms of cervical myelopathy.  The symptoms are causing a significant impact on the patient's life.   Review of Systems:  A 10 point review of systems is negative, except for the pertinent positives and negatives detailed in the HPI.  Past Medical History: Past Medical History:  Diagnosis Date   Asthma    Diabetes mellitus without complication (HCC)    Gout    Hypercholesterolemia    Hyperglycemia    Hypertension    Obesity    Osteoarthritis of both knees    Sleep apnea     Past Surgical History: No past surgical history on file.  Allergies: Allergies as of 07/30/2023   (No Known Allergies)    Medications: Outpatient Encounter Medications as of 07/30/2023  Medication Sig   albuterol (VENTOLIN HFA) 108 (90 Base) MCG/ACT inhaler Inhale 2 puffs into the lungs every 6 (six) hours as needed for wheezing.   allopurinol (ZYLOPRIM) 300 MG tablet Take 300 mg by mouth daily.   amLODipine (NORVASC) 2.5 MG tablet Take 2.5 mg by mouth daily.   Cholecalciferol 100 MCG (4000 UT) CAPS Take 1 capsule by mouth daily.   gabapentin (NEURONTIN) 300 MG capsule Take 300 mg by mouth 3 (three) times daily.   ibuprofen (ADVIL) 800 MG tablet Take 800 mg by mouth daily as needed.   losartan (COZAAR) 100 MG tablet Take 100 mg by mouth daily.   metFORMIN (GLUCOPHAGE) 500 MG tablet Take 500 mg by mouth 2 (  two) times daily with a meal.   Multiple Vitamin (MULTI-VITAMIN) tablet Take 1 tablet by mouth daily.   OZEMPIC, 0.25 OR 0.5 MG/DOSE, 2 MG/3ML SOPN Inject 0.75 mLs into the skin once a week.   No facility-administered encounter medications on file as of 07/30/2023.    Social History: Social History   Tobacco Use   Smoking status: Never   Smokeless tobacco: Never  Substance Use Topics   Alcohol use: Yes    Comment: occ   Drug use: Never    Family Medical History: No family history on file.  Physical Examination: There were no vitals filed for this visit.    Awake, alert, oriented to  person, place, and time.  Speech is clear and fluent. Fund of knowledge is appropriate.   Cranial Nerves: Pupils equal round and reactive to light.  Facial tone is symmetric.    Mild left sided lower posterior lumbar tenderness.   No abnormal lesions on exposed skin.   Strength: Side Biceps Triceps Deltoid Interossei Grip Wrist Ext. Wrist Flex.  R 5 5 5 5 5 5 5   L 5 5 5 5 5 5 5    Side Iliopsoas Quads Hamstring PF DF EHL  R 5 5 5 5 5 5   L 5 5 5 5 5 5    Reflexes are 2+ and symmetric at the biceps, triceps, brachioradialis, patella and achilles.   Hoffman's is absent.  Clonus is not present.   Bilateral upper and lower extremity sensation is intact to light touchm, but diminished in left lateral calf and medial ankle.   He ambulates slowly with a cane.    Medical Decision Making  Imaging: None    Assessment and Plan: Chad Patel is a pleasant 57 y.o. male has  4 week history of more constant LBP/left buttock pain that radiates into left leg to his ankle. Pain started after doing heavy lifting at work on 04/27/23. No right leg pain. He feels some weakness in left leg- he is having to use a cane.   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 Also with mild left foraminal stenosis L5-S1. Primary pain generator is likely L4-L5.   Treatment options discussed with patient and following plan made:   - Order for physical therapy for lumbar spine.  - Continue current medications including neurontin. Reviewed dosing and side effects.  - Referral to PMR/pain management to discuss possible lumbar injections.  - He is under WC. Will let them know about PT and injections orders and they will let him know where he will be scheduled.  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  - Weight loss discussed and encouraged.  - Follow up with me in 6-8 weeks and prn.   I spent a total  of 30 minutes in face-to-face and non-face-to-face activities related to this patient's care today including review of outside records, review of imaging, review of symptoms, physical exam, discussion of differential diagnosis, discussion of treatment options, and documentation.   Thank you for involving me in the care of this patient.   Drake Leach PA-C Dept. of Neurosurgery

## 2023-07-18 NOTE — Telephone Encounter (Signed)
Forms have been filled out and faxed. Patient is aware.

## 2023-07-30 ENCOUNTER — Ambulatory Visit: Payer: BC Managed Care – PPO | Admitting: Orthopedic Surgery

## 2023-08-06 NOTE — Progress Notes (Unsigned)
Referring Physician:  No referring provider defined for this encounter.  Primary Physician:  Barbette Reichmann, MD  History of Present Illness: 08/06/2023 [name] has a history of DM, HTN, obesity, OSA with CPAP, gout, asthma.   Last seen by me on 06/05/23 for LBP and left leg pain that started after doing heavy lifting at work on 04/27/23. 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 Also with mild left foraminal stenosis L5-S1. Primary pain generator is likely L4-L5.   He was sent to PT and referred to PMR to discuss lumbar injections. He is out of work. Case was denied by WC.   He had bilateral L5-S1 TF ESI on 08/02/23 with Dr. Mariah Milling. She also sent him to PT (has not started yet).   He is here for follow up.   He had improvement with the injection in both his back and leg pain. Still with constant LBP but left leg pain is only intermittent. He notes some recent pain in both legs from knees down with some numbness that is intermittent.   He was terminated from his job.   He is taking neurontin and advil.   Bowel/Bladder Dysfunction: none  Last HgbA1c was 6.2 on 04/23/23.   Conservative measures:  Physical therapy: has not participated  Multimodal medical therapy including regular antiinflammatories: percocet, motrin, ultram, prednisone, neurontin, zanaflex   Injections:  bilateral L5-S1 TF ESI on 08/02/23 with Dr. Mariah Milling  Past Surgery: no previous spine surgery  Chad Patel has no symptoms of cervical myelopathy.  The symptoms are causing a significant impact on the patient's life.   Review of Systems:  A 10 point review of systems is negative, except for the pertinent positives and negatives detailed in the HPI.  Past Medical History: Past Medical History:  Diagnosis Date   Asthma    Diabetes mellitus without complication (HCC)    Gout     Hypercholesterolemia    Hyperglycemia    Hypertension    Obesity    Osteoarthritis of both knees    Sleep apnea     Past Surgical History: No past surgical history on file.  Allergies: Allergies as of 08/16/2023   (No Known Allergies)    Medications: Outpatient Encounter Medications as of 08/16/2023  Medication Sig   albuterol (VENTOLIN HFA) 108 (90 Base) MCG/ACT inhaler Inhale 2 puffs into the lungs every 6 (six) hours as needed for wheezing.   allopurinol (ZYLOPRIM) 300 MG tablet Take 300 mg by mouth daily.   amLODipine (NORVASC) 2.5 MG tablet Take 2.5 mg by mouth daily.   Cholecalciferol 100 MCG (4000 UT) CAPS Take 1 capsule by mouth daily.   gabapentin (NEURONTIN) 300 MG capsule Take 300 mg by mouth 3 (three) times daily.   ibuprofen (ADVIL) 800 MG tablet Take 800 mg by mouth daily as needed.   losartan (COZAAR) 100 MG tablet Take 100 mg by mouth daily.   metFORMIN (GLUCOPHAGE) 500 MG tablet Take 500 mg by mouth 2 (two) times daily with a meal.   Multiple Vitamin (MULTI-VITAMIN) tablet Take 1 tablet by mouth daily.   OZEMPIC, 0.25 OR 0.5 MG/DOSE, 2 MG/3ML SOPN Inject 0.75 mLs into the skin once a week.   No facility-administered encounter medications on file as of 08/16/2023.    Social History: Social History   Tobacco Use   Smoking status: Never   Smokeless tobacco: Never  Substance Use  Topics   Alcohol use: Yes    Comment: occ   Drug use: Never    Family Medical History: No family history on file.  Physical Examination: There were no vitals filed for this visit.    Awake, alert, oriented to person, place, and time.  Speech is clear and fluent. Fund of knowledge is appropriate.   Cranial Nerves: Pupils equal round and reactive to light.  Facial tone is symmetric.    Mild left and right sided lower posterior lumbar tenderness.   No abnormal lesions on exposed skin.   Strength:  Side Iliopsoas Quads Hamstring PF DF EHL  R 5 5 5 5 5 5   L 5 5 5 5 5 5     Reflexes are 2+ and symmetric at the patella and achilles.    Clonus is not present.   Bilateral lower extremity sensation is intact to light touch, but diminished in left lateral calf and medial ankle.   He has a slight limp. Not using a cane.    Medical Decision Making  Imaging: None    Assessment and Plan: Chad Patel is a pleasant 57 y.o. male who has seen improvement in both his back and leg pain since having the injection. Still with constant LBP but left leg pain is only intermittent. He notes some recent pain in both legs from knees down with some numbness that is intermittent.   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 Also with mild left foraminal stenosis L5-S1. Primary pain generator is likely L4-L5.   Treatment options discussed with patient and following plan made:   - Order for physical therapy for lumbar spine.  - Continue current medications including neurontin. Reviewed dosing and side effects.  - Follow up as scheduled with Dr. Mariah Milling.  - Weight loss discussed and encouraged.  - Follow up with me in 6-8 weeks and prn.   I spent a total of  20 minutes in face-to-face and non-face-to-face activities related to this patient's care today including review of outside records, review of imaging, review of symptoms, physical exam, discussion of differential diagnosis, discussion of treatment options, and documentation.   Drake Leach PA-C Dept. of Neurosurgery

## 2023-08-16 ENCOUNTER — Encounter: Payer: Self-pay | Admitting: Orthopedic Surgery

## 2023-08-16 ENCOUNTER — Ambulatory Visit (INDEPENDENT_AMBULATORY_CARE_PROVIDER_SITE_OTHER): Payer: BC Managed Care – PPO | Admitting: Orthopedic Surgery

## 2023-08-16 VITALS — BP 130/76 | Ht 65.0 in | Wt 322.0 lb

## 2023-08-16 DIAGNOSIS — M5416 Radiculopathy, lumbar region: Secondary | ICD-10-CM

## 2023-08-16 DIAGNOSIS — M5126 Other intervertebral disc displacement, lumbar region: Secondary | ICD-10-CM

## 2023-08-16 DIAGNOSIS — M5116 Intervertebral disc disorders with radiculopathy, lumbar region: Secondary | ICD-10-CM | POA: Diagnosis not present

## 2023-08-16 DIAGNOSIS — M48061 Spinal stenosis, lumbar region without neurogenic claudication: Secondary | ICD-10-CM

## 2023-08-16 NOTE — Patient Instructions (Signed)
It was so nice to see you today. Thank you so much for coming in.    I am glad you are feeling better!  I sent physical therapy orders to [facility]. You can call them at [phone] if you don't hear from them to schedule your visit.   Keep your follow up with Dr. Mariah Milling.    I will see you back in 6-8 weeks. Please do not hesitate to call if you have any questions or concerns. You can also message me in MyChart.   Drake Leach PA-C [phone]

## 2023-09-29 NOTE — Progress Notes (Unsigned)
Referring Physician:  No referring provider defined for this encounter.  Primary Physician:  Barbette Reichmann, MD  History of Present Illness: 10/04/2023 Chad Patel has a history of DM, HTN, obesity, OSA with CPAP, gout, asthma.   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 Also with mild left foraminal stenosis L5-S1. Primary pain generator is likely L4-L5.   He is improving with PT. He saw PMR and no injections recommended as he was doing so much better.   He is here for follow up.   He still has intermittent back pain with prolonged standing. By the end of the day, he has some numbness in left foot as well. Overall, he is doing much better. His current symptoms are tolerable. 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   He is taking prn neurontin and prn advil.   Bowel/Bladder Dysfunction: none  Last HgbA1c was 6.2 on 04/23/23.   Conservative measures:  Physical therapy: going to [facility] now Multimodal medical therapy including regular antiinflammatories: percocet, motrin, ultram, prednisone, neurontin, zanaflex   Injections:  bilateral L5-S1 TF ESI on 08/02/23 with Dr. Mariah Milling  Past Surgery: no previous spine surgery  Chad Patel has no symptoms of cervical myelopathy.  The symptoms are causing a significant impact on the patient's life.   Review of Systems:  A 10 point review of systems is negative, except for the pertinent positives and negatives detailed in the HPI.  Past Medical History: Past Medical History:  Diagnosis Date   Asthma    Diabetes mellitus without complication (HCC)    Gout    Hypercholesterolemia    Hyperglycemia    Hypertension    Obesity    Osteoarthritis of both knees    Sleep apnea     Past Surgical History: History  reviewed. No pertinent surgical history.  Allergies: Allergies as of 10/04/2023   (No Known Allergies)    Medications: Outpatient Encounter Medications as of 10/04/2023  Medication Sig   albuterol (VENTOLIN HFA) 108 (90 Base) MCG/ACT inhaler Inhale 2 puffs into the lungs every 6 (six) hours as needed for wheezing.   allopurinol (ZYLOPRIM) 300 MG tablet Take 300 mg by mouth daily.   amLODipine (NORVASC) 2.5 MG tablet Take 2.5 mg by mouth daily.   Cholecalciferol 100 MCG (4000 UT) CAPS Take 1 capsule by mouth daily.   gabapentin (NEURONTIN) 300 MG capsule Take 300 mg by mouth 3 (three) times daily.   ibuprofen (ADVIL) 800 MG tablet Take 800 mg by mouth daily as needed.   losartan (COZAAR) 100 MG tablet Take 100 mg by mouth daily.   metFORMIN (GLUCOPHAGE) 500 MG tablet Take 500 mg by mouth 2 (two) times daily with a meal.   Multiple Vitamin (MULTI-VITAMIN) tablet Take 1 tablet by mouth daily.   OZEMPIC, 0.25 OR 0.5 MG/DOSE, 2 MG/3ML SOPN Inject 0.75 mLs into the skin once a week.   No facility-administered encounter medications on file as of 10/04/2023.    Social History: Social History   Tobacco Use   Smoking status: Never   Smokeless tobacco: Never  Substance Use Topics   Alcohol use: Yes    Comment: occ   Drug use: Never    Family Medical History: History reviewed. No pertinent family history.  Physical Examination: Vitals:   10/04/23 1610  BP: 115/80      Awake, alert, oriented to person, place, and time.  Speech is clear and fluent. Fund of knowledge is appropriate.   Cranial Nerves: Pupils equal round and reactive to light.  Facial tone is symmetric.    No abnormal lesions on exposed skin.   Strength:  Side Iliopsoas Quads Hamstring PF DF EHL  R 5 5 5 5 5 5   L 5 5 5 5 5 5    He can heel/toe stand.   Bilateral lower extremity sensation is intact to light touch, but diminished in left lateral calf.   Normal gait.   Medical Decision  Making  Imaging: None    Assessment and Plan: Chad Patel is a pleasant 57 y.o. male who has seen improvement in both his back and leg pain with PT and ESI.   He still has intermittent back pain with prolonged standing. By the end of the day, he has some numbness in left foot as well. Overall, he is doing much better. His current symptoms are tolerable.  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 Also with mild left foraminal stenosis L5-S1. Primary pain generator is likely L4-L5.   Treatment options discussed with patient and following plan made:   - Finish out PT for lumbar spine then continue on HEP.  - He is taking neurontin prn. Discussed that it will help more if he takes it regularly. Reviewed dosing and side effects.  - Weight loss discussed and encouraged.  - Follow up with me prn.   I spent a total of  15 minutes in face-to-face and non-face-to-face activities related to this patient's care today including review of outside records, review of imaging, review of symptoms, physical exam, discussion of differential diagnosis, discussion of treatment options, and documentation.   Drake Leach PA-C Dept. of Neurosurgery

## 2023-10-04 ENCOUNTER — Encounter: Payer: Self-pay | Admitting: Orthopedic Surgery

## 2023-10-04 ENCOUNTER — Ambulatory Visit: Payer: BC Managed Care – PPO | Admitting: Orthopedic Surgery

## 2023-10-04 VITALS — BP 115/80 | Ht 65.0 in | Wt 322.0 lb

## 2023-10-04 DIAGNOSIS — M5416 Radiculopathy, lumbar region: Secondary | ICD-10-CM

## 2023-10-04 DIAGNOSIS — M5126 Other intervertebral disc displacement, lumbar region: Secondary | ICD-10-CM | POA: Diagnosis not present

## 2023-10-04 DIAGNOSIS — M48061 Spinal stenosis, lumbar region without neurogenic claudication: Secondary | ICD-10-CM | POA: Diagnosis not present
